# Patient Record
Sex: Male | Born: 1970 | Race: White | Hispanic: No | Marital: Married | Smoking: Never smoker
Health system: Southern US, Community
[De-identification: ages and names within clinical notes are randomized; demographics above are authoritative.]

## PROBLEM LIST (undated history)

## (undated) DIAGNOSIS — I10 Essential (primary) hypertension: Secondary | ICD-10-CM

## (undated) DIAGNOSIS — K219 Gastro-esophageal reflux disease without esophagitis: Secondary | ICD-10-CM

## (undated) DIAGNOSIS — K259 Gastric ulcer, unspecified as acute or chronic, without hemorrhage or perforation: Secondary | ICD-10-CM

## (undated) HISTORY — PX: KNEE SURGERY: SHX244

## (undated) HISTORY — PX: APPENDECTOMY: SHX54

## (undated) HISTORY — DX: Gastro-esophageal reflux disease without esophagitis: K21.9

## (undated) HISTORY — DX: Gastric ulcer, unspecified as acute or chronic, without hemorrhage or perforation: K25.9

---

## 2005-10-24 ENCOUNTER — Emergency Department: Payer: Self-pay | Admitting: Emergency Medicine

## 2006-03-21 ENCOUNTER — Ambulatory Visit: Payer: Self-pay | Admitting: Gastroenterology

## 2006-04-04 ENCOUNTER — Ambulatory Visit: Payer: Self-pay | Admitting: Gastroenterology

## 2008-06-17 ENCOUNTER — Ambulatory Visit: Payer: Self-pay | Admitting: Unknown Physician Specialty

## 2008-08-12 ENCOUNTER — Ambulatory Visit: Payer: Self-pay | Admitting: Unknown Physician Specialty

## 2008-08-19 ENCOUNTER — Ambulatory Visit: Payer: Self-pay | Admitting: Unknown Physician Specialty

## 2009-09-24 ENCOUNTER — Ambulatory Visit: Payer: Self-pay | Admitting: Otolaryngology

## 2017-02-05 ENCOUNTER — Ambulatory Visit
Admission: EM | Admit: 2017-02-05 | Discharge: 2017-02-05 | Disposition: A | Discharge status: Home / Self Care | Payer: BLUE CROSS/BLUE SHIELD | Attending: Family Medicine | Admitting: Family Medicine

## 2017-02-05 ENCOUNTER — Encounter: Payer: Self-pay | Admitting: *Deleted

## 2017-02-05 DIAGNOSIS — R319 Hematuria, unspecified: Secondary | ICD-10-CM | POA: Diagnosis not present

## 2017-02-05 DIAGNOSIS — R1030 Lower abdominal pain, unspecified: Secondary | ICD-10-CM

## 2017-02-05 DIAGNOSIS — Z87448 Personal history of other diseases of urinary system: Secondary | ICD-10-CM

## 2017-02-05 DIAGNOSIS — N410 Acute prostatitis: Secondary | ICD-10-CM

## 2017-02-05 DIAGNOSIS — M545 Low back pain: Secondary | ICD-10-CM | POA: Diagnosis not present

## 2017-02-05 HISTORY — DX: Essential (primary) hypertension: I10

## 2017-02-05 LAB — URINALYSIS, COMPLETE (UACMP) WITH MICROSCOPIC
BILIRUBIN URINE: NEGATIVE
Glucose, UA: NEGATIVE mg/dL
KETONES UR: NEGATIVE mg/dL
LEUKOCYTES UA: NEGATIVE
Nitrite: NEGATIVE
PH: 5.5 (ref 5.0–8.0)
PROTEIN: NEGATIVE mg/dL
SQUAMOUS EPITHELIAL / LPF: NONE SEEN
Specific Gravity, Urine: 1.025 (ref 1.005–1.030)

## 2017-02-05 LAB — CHLAMYDIA/NGC RT PCR (ARMC ONLY)
CHLAMYDIA TR: NOT DETECTED
N GONORRHOEAE: NOT DETECTED

## 2017-02-05 MED ORDER — CIPROFLOXACIN HCL 500 MG PO TABS: 500.0000 mg | ORAL_TABLET | Freq: Two times a day (BID) | ORAL | 0 refills | Status: DC

## 2017-02-05 MED ORDER — TAMSULOSIN HCL 0.4 MG PO CAPS: 0.4000 mg | ORAL_CAPSULE | Freq: Every day | ORAL | 0 refills | Status: DC

## 2017-02-05 NOTE — ED Provider Notes (Signed)
[facility]    CSN: [id number] Arrival date & time: 02/05/17  1057     History   Chief Complaint Chief Complaint  Patient presents with  . Abdominal Pain  . Back Pain    HPI NOHA KARASIK is a 46 y.o. male.   Patient is a 46 year old white male who is here because of multiple concerns multiple problems. Basically he's had lower back pain for over the last 3 months. Sometimes she's had hematuria in his urine as well and she has gotten real dark. He has had flank pain coming from the right side sometimes ago to his groin area. He is at other times the pain has not been that severe. This is been episodic with the pain occurring and then going away. States about 2 weeks ago he has severe pain in the axial took a couple days 4 days in fact of his Cipro that he had left over from a previous infection. He's had epididymitis before and Cipro has worked well for that. He's had a vasectomy. And had epididymitis after the vasectomy. He still had a documented kidney stones before however he states his father has had prostate trouble epididymitis also after having a vasectomy and also has had she stones multiple times. His father back told him he thought there was kidney stone than they change his mind because of change in the symptoms. He reports last 48 hours progressing lower back pain. Pain goes into his groin area and over both scrotal area. He states that when he has acute pain seems to retard ejaculation but that sometimes ejaculation does seem to help with the pain Vicodin last night but the pain has returned. He's never had a CT scan of his abdomen Limited evaluate for kidney stones. Smoked a stuffy tobacco or using illicit drugs. No previous surgeries other than vasectomy and he has had a colonoscopy. States that about age 8 he had a colonoscopy 2 polyps removed and he was frozen to have a repeat colonoscopy probably about 40-43. He is on Prilosec which she started when he was 15 and  that helps control his reflux. He's had upper endoscopy as well as colonoscopy. He states the last few days he has not seen blood in his urine but at least since December he's had 4 episodes where she became more dark and he thought he saw blood.   The history is provided by the patient. No language interpreter was used.  Abdominal Pain  Pain location:  Suprapubic, L flank and R flank Pain quality: aching, sharp, stabbing and throbbing   Pain radiates to:  Suprapubic region Pain severity:  Moderate Timing:  Intermittent Progression:  Worsening Chronicity:  New Relieved by:  Nothing Worsened by:  Nothing Ineffective treatments: cipro. Associated symptoms: hematuria   Associated symptoms: no chills, no constipation, no diarrhea, no melena, no nausea, no shortness of breath and no sore throat   Back Pain  Associated symptoms: abdominal pain     Past Medical History:  Diagnosis Date  . Hypertension     There are no active problems to display for this patient.   Past Surgical History:  Procedure Laterality Date  . APPENDECTOMY    . KNEE SURGERY         Home Medications    Prior to Admission medications   Medication Sig Start Date End Date Taking? Authorizing Provider  Ascorbic Acid (VITAMIN C) 1000 MG tablet Take 1,000 mg by mouth daily.   Yes Historical  Provider, MD  lisinopril (PRINIVIL,ZESTRIL) 10 MG tablet Take 10 mg by mouth daily.   Yes Historical Provider, MD  Omega-3 Fatty Acids (FISH OIL) 1000 MG CAPS Take by mouth.   Yes Historical Provider, MD  omeprazole (PRILOSEC) 20 MG capsule Take 20 mg by mouth daily.   Yes Historical Provider, MD    Family History Family History  Problem Relation Age of Onset  . Kidney Stones Father     Social History Social History  Substance Use Topics  . Smoking status: Never Smoker  . Smokeless tobacco: Never Used  . Alcohol use No     Allergies   Patient has no known allergies.   Review of Systems Review of Systems    Constitutional: Negative for chills.  HENT: Negative for sore throat.   Respiratory: Negative for shortness of breath.   Gastrointestinal: Positive for abdominal pain. Negative for constipation, diarrhea, melena and nausea.  Genitourinary: Positive for hematuria.  Musculoskeletal: Positive for back pain.  All other systems reviewed and are negative.    Physical Exam Triage Vital Signs ED Triage Vitals  Enc Vitals Group     BP 02/05/17 1128 (!) 162/114     Pulse Rate 02/05/17 1128 80     Resp 02/05/17 1128 18     Temp 02/05/17 1128 98.4 F (36.9 C)     Temp src --      SpO2 02/05/17 1128 100 %     Weight 02/05/17 1129 240 lb (108.9 kg)     Height 02/05/17 1129 6' (1.829 m)     Head Circumference --      Peak Flow --      Pain Score 02/05/17 1132 7     Pain Loc --      Pain Edu? --      Excl. in GC? --    No data found.   Updated Vital Signs BP (!) 162/114 (BP Location: Left Arm)   Pulse 80   Temp 98.4 F (36.9 C)   Resp 18   Ht 6' (1.829 m)   Wt 240 lb (108.9 kg)   SpO2 100%   BMI 32.55 kg/m   Visual Acuity Right Eye Distance:   Left Eye Distance:   Bilateral Distance:    Right Eye Near:   Left Eye Near:    Bilateral Near:     Physical Exam  Constitutional: He appears well-developed and well-nourished. He appears distressed.  HENT:  Head: Normocephalic and atraumatic.  Right Ear: External ear normal.  Left Ear: External ear normal.  Neck: Normal range of motion.  Cardiovascular: Normal rate and regular rhythm.   Pulmonary/Chest: Effort normal.  Abdominal: There is no hepatosplenomegaly. There is tenderness. There is no rigidity, no rebound, no guarding and no CVA tenderness. Hernia confirmed negative in the ventral area and confirmed negative in the right inguinal area.    Genitourinary: Rectum normal and testes normal. Prostate is enlarged and tender. Right testis shows no mass, no swelling and no tenderness. Right testis is descended. Cremasteric  reflex is not absent on the right side. Left testis shows no mass, no swelling and no tenderness. Left testis is descended. Cremasteric reflex is not absent on the left side. Circumcised. No hypospadias or penile tenderness.  Genitourinary Comments: Prostate slightly enlarged but markedly tender to palpation unable to even do Prostalac massage because of mild discomfort cost to the patient.  Vitals reviewed.    UC Treatments / Results  Labs (all labs ordered are  listed, but only abnormal results are displayed) Labs Reviewed  URINE CULTURE  CHLAMYDIA/NGC RT PCR ([facility] ONLY)  URINALYSIS, COMPLETE ([facility]MP) WITH MICROSCOPIC    EKG  EKG Interpretation None       Radiology No results found.  Procedures Procedures (including critical care time)  Medications Ordered in UC Medications - No data to display   Initial Impression / Assessment and Plan / UC Course  I have reviewed the triage vital signs and the nursing notes.  Pertinent labs & imaging results that were available during my care of the patient were reviewed by me and considered in my medical decision making (see chart for details).     While think patient has at least prostatitis still worried about the blood in his urine if he still has blood in his urine I'm going to recommend definite CT scan for stone study as well. Also 30 year be obtained as well  Final Clinical Impressions(s) / UC Diagnoses   Final diagnoses:  Acute prostatitis  Hx of hematuria    New Prescriptions New Prescriptions   No medications on file     Hassan Rowan, MD 02/05/17 2118

## 2017-02-05 NOTE — ED Triage Notes (Signed)
Patient started having lower abdominal pain and back pain 1 month ago that has come and gone. Patient also reports blood in urine and that he has taken a family members antibiotic with out relief.

## 2017-02-06 LAB — URINE CULTURE
CULTURE: NO GROWTH
Special Requests: NORMAL

## 2017-02-15 ENCOUNTER — Telehealth: Payer: Self-pay

## 2017-02-15 MED ORDER — CIPROFLOXACIN HCL 500 MG PO TABS: 500.0000 mg | ORAL_TABLET | Freq: Two times a day (BID) | ORAL | 0 refills | Status: DC

## 2017-02-15 NOTE — Telephone Encounter (Signed)
Patient called in to inquire about getting more of the Cipro to finish out his treatment. I spoke with Colon Flattery, PA and he called in Cipro  #20 BID

## 2017-02-19 NOTE — ED Notes (Signed)
I called [facility] and asked if they would please see Marcus Shaw for Enlarged prostate and hematuria. They are calling him to schedule appt.

## 2017-02-22 ENCOUNTER — Ambulatory Visit: Payer: BLUE CROSS/BLUE SHIELD | Admitting: Urology

## 2017-02-22 ENCOUNTER — Encounter: Payer: Self-pay | Admitting: Urology

## 2017-02-22 VITALS — BP 134/84 | HR 76 | Ht 72.0 in | Wt 240.0 lb

## 2017-02-22 DIAGNOSIS — M545 Low back pain, unspecified: Secondary | ICD-10-CM

## 2017-02-22 DIAGNOSIS — N410 Acute prostatitis: Secondary | ICD-10-CM

## 2017-02-22 DIAGNOSIS — N411 Chronic prostatitis: Secondary | ICD-10-CM

## 2017-02-22 DIAGNOSIS — G8929 Other chronic pain: Secondary | ICD-10-CM | POA: Diagnosis not present

## 2017-02-22 DIAGNOSIS — R310 Gross hematuria: Secondary | ICD-10-CM

## 2017-02-22 LAB — URINALYSIS, COMPLETE
BILIRUBIN UA: NEGATIVE
GLUCOSE, UA: NEGATIVE
KETONES UA: NEGATIVE
Leukocytes, UA: NEGATIVE
Nitrite, UA: NEGATIVE
Protein, UA: NEGATIVE
Specific Gravity, UA: 1.025 (ref 1.005–1.030)
Urobilinogen, Ur: 0.2 mg/dL (ref 0.2–1.0)
pH, UA: 6.5 (ref 5.0–7.5)

## 2017-02-22 LAB — BLADDER SCAN AMB NON-IMAGING

## 2017-02-22 MED ORDER — KETOROLAC TROMETHAMINE 60 MG/2ML IM SOLN
30.0000 mg | Freq: Once | INTRAMUSCULAR | Status: AC
  Administered 2017-02-22: 30 mg via INTRAMUSCULAR

## 2017-02-22 MED ORDER — IBUPROFEN 800 MG PO TABS: 800.0000 mg | ORAL_TABLET | Freq: Three times a day (TID) | ORAL | 0 refills | Status: AC | PRN

## 2017-02-22 NOTE — Progress Notes (Signed)
IM Injection  Patient is present today for an IM Injection for treatment of pain. Drug: Ketorlac Dose:30mg /68ml Location:left upper outer buttocks Lot: [id number] Exp:05/2018 Patient tolerated well, no complications were noted  Preformed by: Eligha Bridegroom, CMA

## 2017-02-22 NOTE — Progress Notes (Signed)
02/22/2017 1:09 PM   Marcus Shaw 1971/05/12 [id number]  Referring provider: No referring provider defined for this encounter.  Chief Complaint  Patient presents with  . New Patient (Initial Visit)    acute prostatitis    HPI: 46 year old male who presents today for further evaluation of possible acute prostatitis.  He reports that since October, he's had increasing issues with lower back pain which she never previously had. About a month ago, he developed severe pain radiating from his groin.  The pain radiated both to his rectal area as well as scrotal area.  After this had been going on for about 2 weeks, he was ultimately seen in the emergency room.  In the ED, he was noted to have a remarkably tender prostate and was started on Cipro for 30 day supply as well as started on Flomax.    He also reports today that he had an episode of gross hematuria that lasted only 1 day. He brings pictures with him from his iPhone showing orange/reddish tinged urine in the bowl. He had no associated dysuria, clots, or any other symptoms. This is not following activity.  He reports that his pain has improved some but not completely resolved.  Along with this, he has symptoms of incomplete bladder emptying, weak stream, difficulty urinating.  No microscopic blood is demonstrated today on UA or on previous labs.    He does have a remote history of epididymitis following vasectomy.   PVR 31 cc     IPSS    Row Name 02/22/17 1100         International Prostate Symptom Score   How often have you had the sensation of not emptying your bladder? Almost always     How often have you had to urinate less than every two hours? Less than half the time     How often have you found you stopped and started again several times when you urinated? More than half the time     How often have you found it difficult to postpone urination? About half the time     How often have you had a weak urinary stream?  About half the time     How often have you had to strain to start urination? About half the time     How many times did you typically get up at night to urinate? 1 Time     Total IPSS Score 21       Quality of Life due to urinary symptoms   If you were to spend the rest of your life with your urinary condition just the way it is now how would you feel about that? Pleased        Score:  1-7 Mild 8-19 Moderate 20-35 Severe   PMH: Past Medical History:  Diagnosis Date  . GERD (gastroesophageal reflux disease)   . Hypertension   . Stomach ulcer     Surgical History: Past Surgical History:  Procedure Laterality Date  . APPENDECTOMY    . KNEE SURGERY      Home Medications:  Allergies as of 02/22/2017   No Known Allergies     Medication List       Accurate as of 02/22/17  1:09 PM. Always use your most recent med list.          ciprofloxacin 500 MG tablet Commonly known as:  CIPRO Take 1 tablet (500 mg total) by mouth 2 (two) times daily.   Fish  Oil 1000 MG Caps Take by mouth.   ibuprofen 800 MG tablet Commonly known as:  ADVIL,MOTRIN Take 1 tablet (800 mg total) by mouth every 8 (eight) hours as needed.   lisinopril 10 MG tablet Commonly known as:  PRINIVIL,ZESTRIL Take 10 mg by mouth daily.   omeprazole 20 MG capsule Commonly known as:  PRILOSEC Take 20 mg by mouth daily.   tamsulosin 0.4 MG Caps capsule Commonly known as:  FLOMAX Take 1 capsule (0.4 mg total) by mouth daily.   vitamin C 1000 MG tablet Take 1,000 mg by mouth daily.       Allergies: No Known Allergies  Family History: Family History  Problem Relation Age of Onset  . Kidney Stones Father   . Prostate cancer Neg Hx   . Kidney cancer Neg Hx     Social History:  reports that he has never smoked. He has never used smokeless tobacco. He reports that he does not drink alcohol or use drugs.  ROS: UROLOGY Frequent Urination?: No Hard to postpone urination?: No Burning/pain with  urination?: Yes Get up at night to urinate?: No Leakage of urine?: No Urine stream starts and stops?: No Trouble starting stream?: No Do you have to strain to urinate?: No Blood in urine?: Yes Urinary tract infection?: No Sexually transmitted disease?: No Injury to kidneys or bladder?: No Painful intercourse?: Yes Weak stream?: No Erection problems?: No Penile pain?: Yes  Gastrointestinal Nausea?: No Vomiting?: No Indigestion/heartburn?: No Diarrhea?: No Constipation?: No  Constitutional Fever: No Night sweats?: No Weight loss?: No Fatigue?: No  Skin Skin rash/lesions?: No Itching?: No  Eyes Blurred vision?: No Double vision?: No  Ears/Nose/Throat Sore throat?: No Sinus problems?: No  Hematologic/Lymphatic Swollen glands?: No Easy bruising?: No  Cardiovascular Leg swelling?: No Chest pain?: No  Respiratory Cough?: No Shortness of breath?: No  Endocrine Excessive thirst?: No  Musculoskeletal Back pain?: No Joint pain?: No  Neurological Headaches?: No Dizziness?: No  Psychologic Depression?: No Anxiety?: No  Physical Exam: BP 134/84   Pulse 76   Ht 6' (1.829 m)   Wt 240 lb (108.9 kg)   BMI 32.55 kg/m   Constitutional:  Alert and oriented, No acute distress.  Accompanied by wife today. HEENT:  AT, moist mucus membranes.  Trachea midline, no masses. Cardiovascular: No clubbing, cyanosis, or edema. Respiratory: Normal respiratory effort, no increased work of breathing. GI: Abdomen is soft, nontender, nondistended, no abdominal masses GU: Mild tenderness in suprapubic area. Circumcised phallus with orthotopic patent meatus without discharge. Bilateral descended testicles unremarkable, nontender, no masses. Rectal: Normal sphincter tone. 30 cc prostate, mildly tender to palpation, no bogginess. Skin: No rashes, bruises or suspicious lesions. Lymph: No cervical or inguinal adenopathy. Neurologic: Grossly intact, no focal deficits, moving  all 4 extremities. Psychiatric: Normal mood and affect.  Laboratory Data:  Urinalysis Results for orders placed or performed in visit on 02/22/17  Urinalysis, Complete  Result Value Ref Range   Specific Gravity, UA 1.025 1.005 - 1.030   pH, UA 6.5 5.0 - 7.5   Color, UA Yellow Yellow   Appearance Ur Clear Clear   Leukocytes, UA Negative Negative   Protein, UA Negative Negative/Trace   Glucose, UA Negative Negative   Ketones, UA Negative Negative   RBC, UA Trace (A) Negative   Bilirubin, UA Negative Negative   Urobilinogen, Ur 0.2 0.2 - 1.0 mg/dL   Nitrite, UA Negative Negative  BLADDER SCAN AMB NON-IMAGING  Result Value Ref Range   Scan Result 31ml  Pertinent Imaging: No recent imaging  Assessment & Plan:    1. Chronic prostatitis Pain most consistent with chronic prostatitis, recommend continuation and completion of course of antibiotics, Flomax and supportive care with NSAIDs  He was given a dose of Toradol here in the office as he is quite uncomfortable after rectal exam, prescription for Motrin 800 mg 3 times a day 7 days given  - Urinalysis, Complete - BLADDER SCAN AMB NON-IMAGING - ketorolac (TORADOL) injection 30 mg; Inject 1 mL (30 mg total) into the muscle once.  2. Gross hematuria We discussed the differential diagnosis for hematuria including nephrolithiasis, renal or upper tract tumors, bladder stones, UTIs, or bladder tumors as well as undetermined etiologies. Per AUA guidelines, I did recommend complete hematuria evaluation including CTU, possible urine cytology, and office cystoscopy.  - CT HEMATURIA WORKUP; Future  3. Chronic midline low back pain without sciatica Low back pain unlikely to be related to current symptoms based on nature and timing - ketorolac (TORADOL) injection 30 mg; Inject 1 mL (30 mg total) into the muscle once.    Return in about 2 weeks (around 03/08/2017) for f/u CT urogram.  Vanna Scotland, MD  [facility] [address], [address] [place], [place] [address] [phone]

## 2017-02-27 ENCOUNTER — Ambulatory Visit
Admission: RE | Admit: 2017-02-27 | Discharge: 2017-02-27 | Disposition: A | Discharge status: Home / Self Care | Payer: BLUE CROSS/BLUE SHIELD | Source: Ambulatory Visit | Attending: Urology | Admitting: Urology

## 2017-02-27 DIAGNOSIS — R310 Gross hematuria: Secondary | ICD-10-CM | POA: Diagnosis not present

## 2017-02-27 MED ORDER — IOPAMIDOL (ISOVUE-300) INJECTION 61%
150.0000 mL | Freq: Once | INTRAVENOUS | Status: AC | PRN
  Administered 2017-02-27: 125 mL via INTRAVENOUS

## 2017-03-08 ENCOUNTER — Other Ambulatory Visit: Payer: Self-pay

## 2017-03-08 DIAGNOSIS — R3129 Other microscopic hematuria: Secondary | ICD-10-CM

## 2017-03-09 ENCOUNTER — Ambulatory Visit: Payer: Self-pay | Admitting: Urology

## 2017-03-09 ENCOUNTER — Other Ambulatory Visit
Admission: RE | Admit: 2017-03-09 | Discharge: 2017-03-09 | Disposition: A | Discharge status: Home / Self Care | Payer: BLUE CROSS/BLUE SHIELD | Source: Ambulatory Visit | Attending: Urology | Admitting: Urology

## 2017-03-09 ENCOUNTER — Encounter: Payer: Self-pay | Admitting: Urology

## 2017-03-09 ENCOUNTER — Ambulatory Visit (INDEPENDENT_AMBULATORY_CARE_PROVIDER_SITE_OTHER): Payer: BLUE CROSS/BLUE SHIELD | Admitting: Urology

## 2017-03-09 VITALS — BP 153/93 | HR 71 | Ht 72.0 in | Wt 240.0 lb

## 2017-03-09 DIAGNOSIS — R3129 Other microscopic hematuria: Secondary | ICD-10-CM

## 2017-03-09 DIAGNOSIS — R310 Gross hematuria: Secondary | ICD-10-CM | POA: Diagnosis not present

## 2017-03-09 DIAGNOSIS — N411 Chronic prostatitis: Secondary | ICD-10-CM

## 2017-03-09 DIAGNOSIS — M545 Low back pain: Secondary | ICD-10-CM

## 2017-03-09 DIAGNOSIS — G8929 Other chronic pain: Secondary | ICD-10-CM

## 2017-03-09 LAB — URINALYSIS, COMPLETE (UACMP) WITH MICROSCOPIC
Bacteria, UA: NONE SEEN
Bilirubin Urine: NEGATIVE
GLUCOSE, UA: NEGATIVE mg/dL
KETONES UR: NEGATIVE mg/dL
Leukocytes, UA: NEGATIVE
NITRITE: NEGATIVE
PROTEIN: NEGATIVE mg/dL
Specific Gravity, Urine: >1.03 — ABNORMAL HIGH (ref 1.005–1.030)
Squamous Epithelial / HPF: NONE SEEN
WBC UA: NONE SEEN WBC/hpf (ref 0–5)
pH: 5.5 (ref 5.0–8.0)

## 2017-03-09 MED ORDER — CIPROFLOXACIN HCL 500 MG PO TABS
500.0000 mg | ORAL_TABLET | Freq: Once | ORAL | Status: AC
  Administered 2017-03-09: 500 mg via ORAL

## 2017-03-09 MED ORDER — TAMSULOSIN HCL 0.4 MG PO CAPS: 0.4000 mg | ORAL_CAPSULE | Freq: Every day | ORAL | 11 refills | Status: DC

## 2017-03-09 NOTE — Progress Notes (Signed)
   03/09/17  CC: gross hematuria  HPI: 46 year old male with a recent episode of prostatitis along with endoscopic and gross hematuria who presents today to complete his hematuria workup.  Since last visit, he is completed a course of NSAIDs, Cipro, and Flomax. Overall, his urinary symptoms have completely resolved. He also notes significant improvement in his lower abdominal/bladder/prostate/testicular pain.  The pain is not completely resolved but its improved mostly after the course of NSAIDs.  He stopped taking Flomax as he ran out of this medication and notes that he's had a worsened urinary stream since stopping this medication.  He continues to have chronic low back pain.  CT urogram on 02/27/2017 is unremarkable for any GU pathology.  Blood pressure (!) 153/93, pulse 71, height 6' (1.829 m), weight 240 lb (108.9 kg). NED. A&Ox3.   No respiratory distress   Abd soft, NT, ND Normal phallus with bilateral descended testicles  Results for orders placed or performed during the hospital encounter of 03/09/17  Urinalysis, Complete w Microscopic  Result Value Ref Range   Color, Urine YELLOW YELLOW   APPearance CLEAR CLEAR   Specific Gravity, Urine >1.030 (H) 1.005 - 1.030   pH 5.5 5.0 - 8.0   Glucose, UA NEGATIVE NEGATIVE mg/dL   Hgb urine dipstick TRACE (A) NEGATIVE   Bilirubin Urine NEGATIVE NEGATIVE   Ketones, ur NEGATIVE NEGATIVE mg/dL   Protein, ur NEGATIVE NEGATIVE mg/dL   Nitrite NEGATIVE NEGATIVE   Leukocytes, UA NEGATIVE NEGATIVE   Squamous Epithelial / LPF NONE SEEN NONE SEEN   WBC, UA NONE SEEN 0 - 5 WBC/hpf   RBC / HPF 6-30 0 - 5 RBC/hpf   Bacteria, UA NONE SEEN NONE SEEN   Mucous PRESENT      Cystoscopy Procedure Note  Patient identification was confirmed, informed consent was obtained, and patient was prepped using Betadine solution.  Lidocaine jelly was administered per urethral meatus.    Preoperative abx where received prior to procedure.      Pre-Procedure: - Inspection reveals a normal caliber ureteral meatus.  Procedure: The flexible cystoscope was introduced without difficulty - No urethral strictures/lesions are present. - Normal prostate  - Normal bladder neck - Bilateral ureteral orifices identified - Bladder mucosa  reveals no ulcers, tumors, or lesions - No bladder stones - No trabeculation  Retroflexion unremarkable   Post-Procedure: - Patient tolerated the procedure well  Assessment/ Plan:  1. Gross hematuria/ microscopic hematuria Work up negative hematuria workup including CT urogram and cystoscopy - ciprofloxacin (CIPRO) tablet 500 mg; Take 1 tablet (500 mg total) by mouth once.  2. Chronic prostatitis Improving- nearly resolved Recommended continuation of Flomax until symptoms completely resolved or continue indefinitely if it helps his urinary stream  3. Chronic midline low back pain without sciatica Continue to suspect musculoskeletal etiology of this positional chronic back pain  Follow up as needed  [name], MD

## 2017-03-13 ENCOUNTER — Ambulatory Visit: Payer: BLUE CROSS/BLUE SHIELD | Admitting: Urology

## 2018-03-26 ENCOUNTER — Other Ambulatory Visit: Payer: Self-pay | Admitting: Urology

## 2018-10-18 IMAGING — CT CT ABD-PEL WO/W CM
3 of 12 series · 12 of 46 positions shown, 18 images · IV contrast (iopamidol)
Comparison: None.

CLINICAL DATA: Pt c/o gross hematuria 1 time during [REDACTED]. Pt
states he has prostatitis currently. Hx of appy. Bilateral Lower
back pain for months. NKI. ^125mL [id number]-RKK IOPAMIDOL
([id number]-RKK) INJECTION 61%

EXAM:
CT ABDOMEN AND PELVIS WITHOUT AND WITH CONTRAST
TECHNIQUE: Multidetector CT imaging of the abdomen and pelvis was performed
following the standard protocol before and following the bolus
administration of intravenous contrast.
CONTRAST:  125mL [id number]-RKK IOPAMIDOL ([id number]) INJECTION 61%

[Series 2: hematuria > 45 wo · axial · 0.76mm/px · z∈[-973,-598]mm · 6 of 107 slices shown, 11 images]
[im 16/107  soft-tissue]
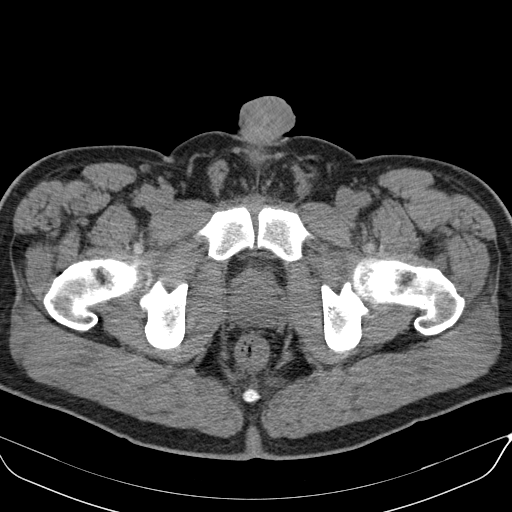
[im 16/107  bone]
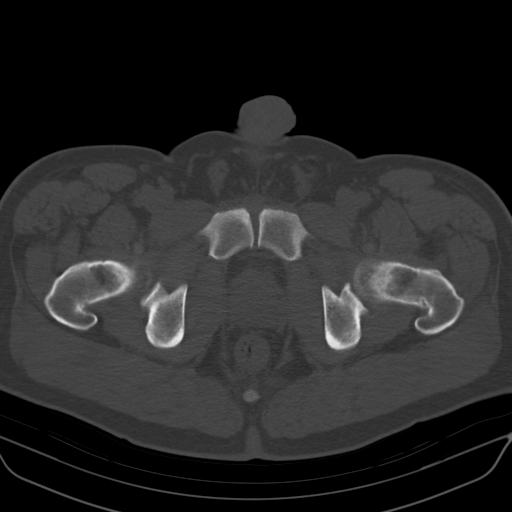
[im 31/107  soft-tissue]
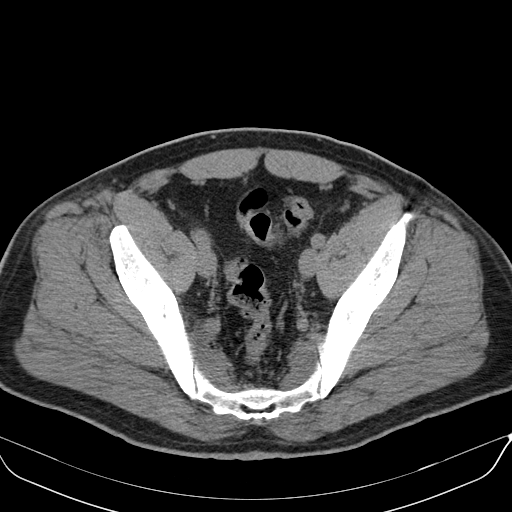
[im 46/107  soft-tissue]
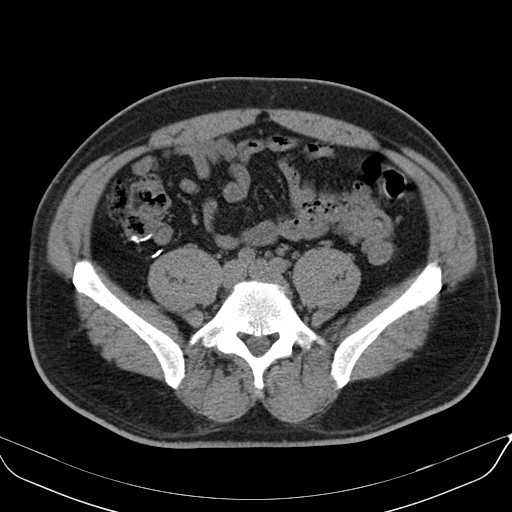
[im 46/107  lung]
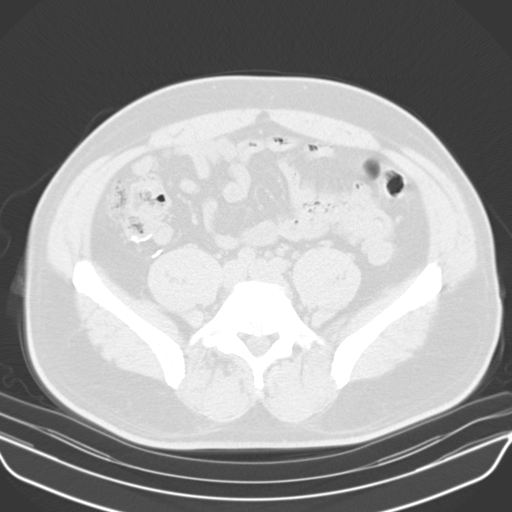
[im 61/107  soft-tissue]
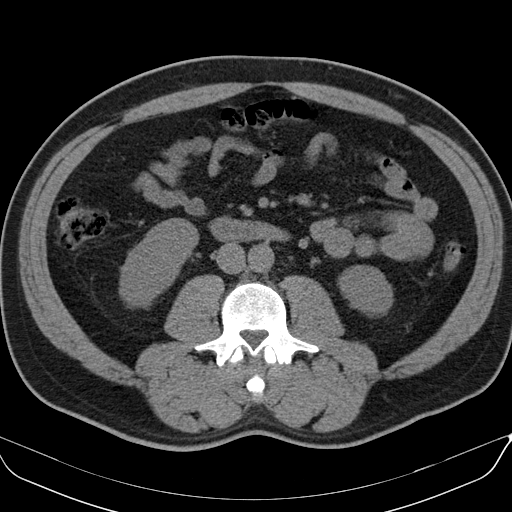
[im 61/107  lung]
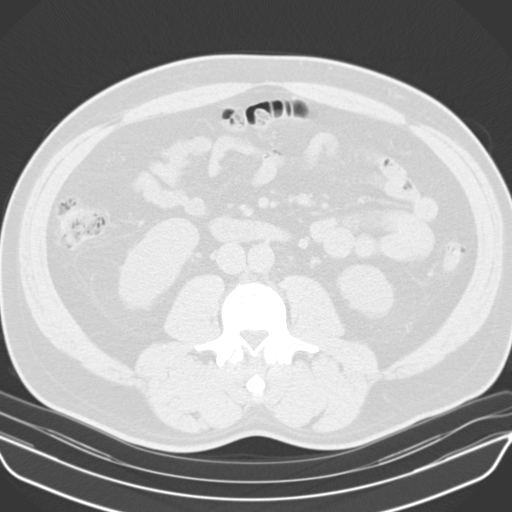
[im 76/107  soft-tissue]
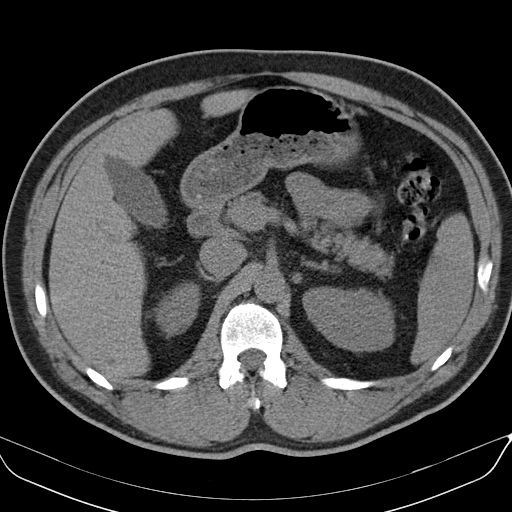
[im 76/107  lung]
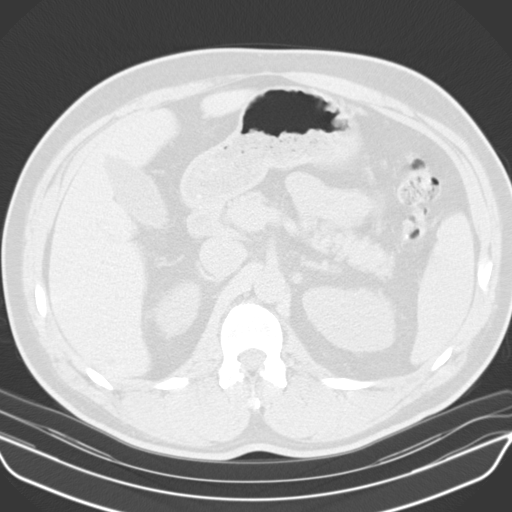
[im 91/107  soft-tissue]
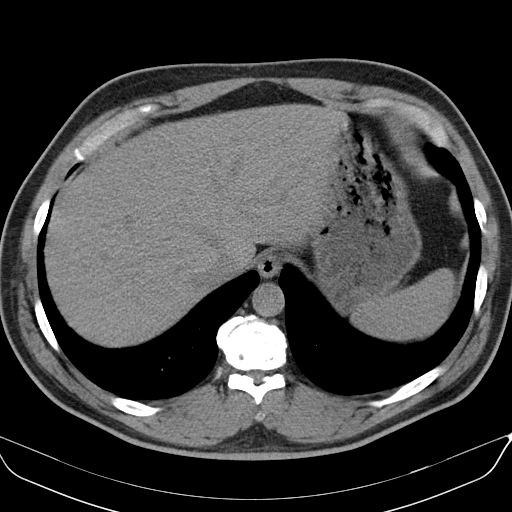
[im 91/107  lung]
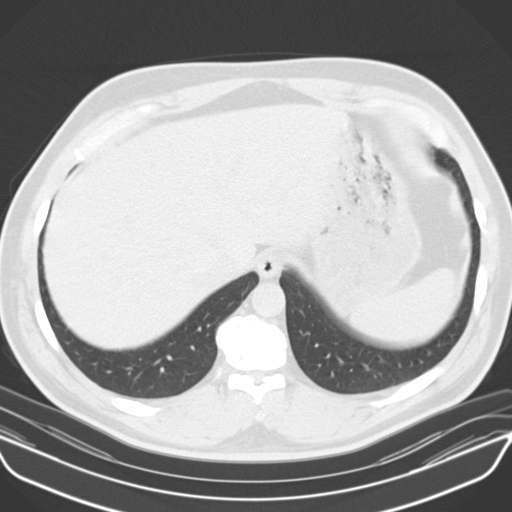

[Series 4: hematuria < 45 with 100s · axial · 0.76mm/px · z∈[-973,-748]mm · 4 of 107 slices shown]
[im 16/107  soft-tissue]
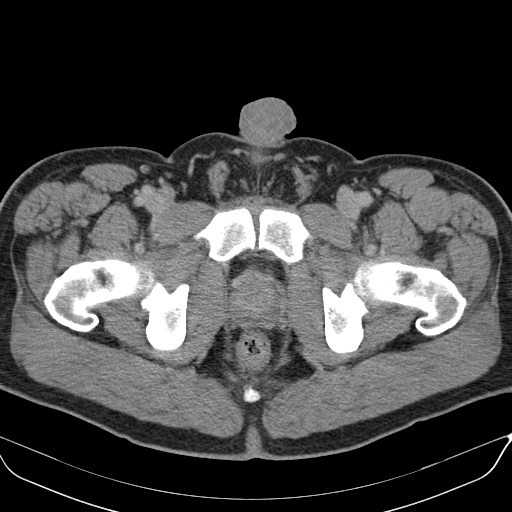
[im 31/107  soft-tissue]
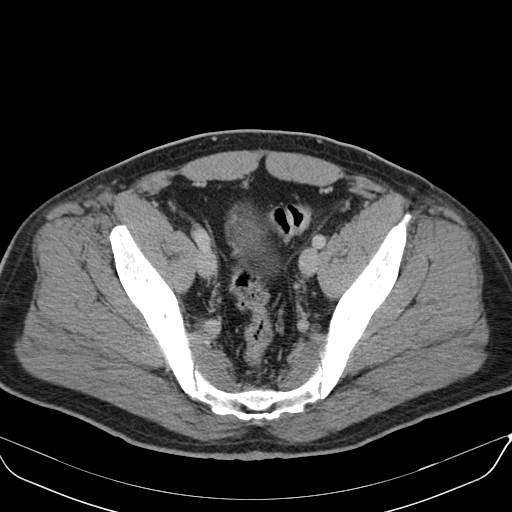
[im 46/107  soft-tissue]
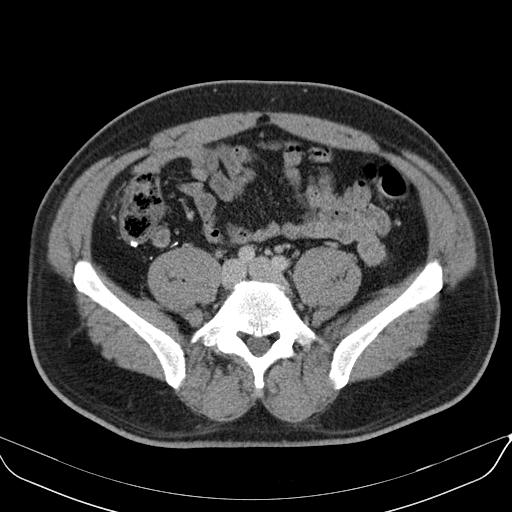
[im 61/107  soft-tissue]
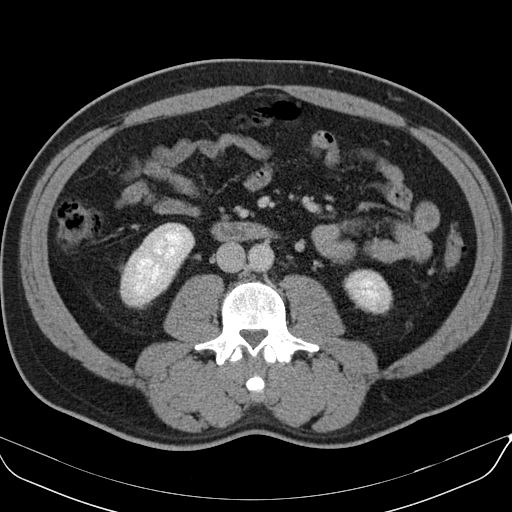

[Series 602: coronal wo · coronal · 1.04mm/px · 2 of 145 slices shown, 3 images]
[im 49/145  soft-tissue]
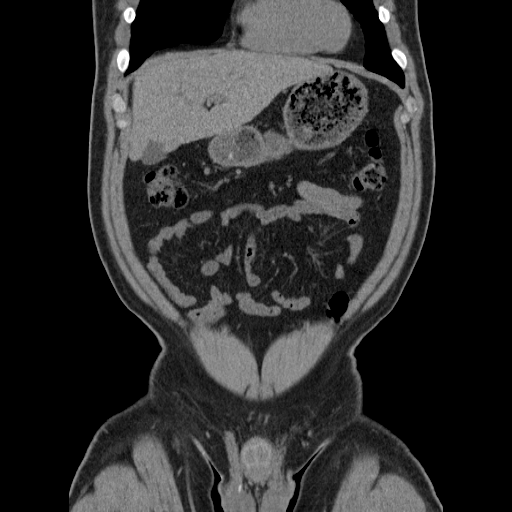
[im 49/145  bone]
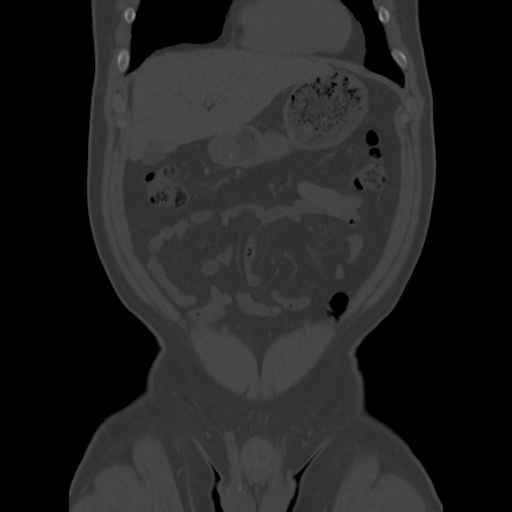
[im 97/145  soft-tissue]
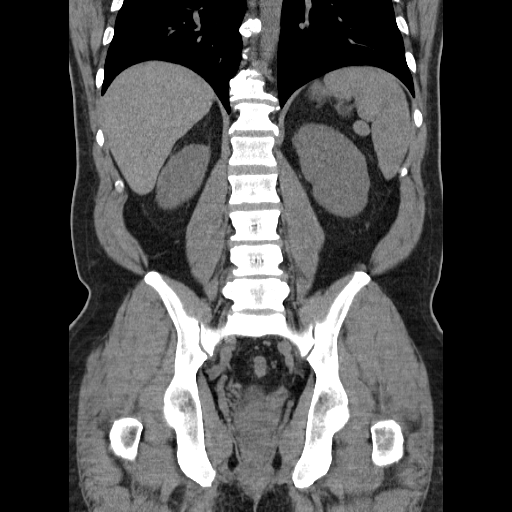

[12 of 46 positions shown; findings below may reference images not displayed]

FINDINGS: Lower chest: Lung bases are clear.

Hepatobiliary: Small round hypodense lesion posterior hepatic lobe
measuring mm (image 31, series 4). Normal gallbladder is

Pancreas: Pancreas is normal. No ductal dilatation. No pancreatic
inflammation.

Spleen: Normal spleen

Adrenals/urinary tract: Adrenal glands.

No nephrolithiasis or ureterolithiasis. No enhancing renal lesion.
Small hypodense lesion measuring 5 mm in the RIGHT renal cortex
(image 43, series 4) is too small to characterize but favor benign
cysts. Upper pole lesion on the RIGHT has low-density (image 36,
series 2).

No filling defects collecting systems.

No bladder calculi, enhancing bladder lesions, or filling defect
within the bladder.

Stomach/Bowel: Stomach, small-bowel cecum normal. Post appendectomy.
Colon rectosigmoid.

Vascular/Lymphatic: Abdominal aorta is normal caliber. There is no
retroperitoneal or periportal lymphadenopathy. No pelvic
lymphadenopathy.

Reproductive: Prostate normal)

Other: No free fluid.

Musculoskeletal: No aggressive osseous lesion.
IMPRESSION: 1. No explanation for hematuria. No nephrolithiasis,
ureterolithiasis, enhancing renal cortical lesion, or filling
defects within the collecting systems.
2. Small hypodense lesions in the RIGHT kidney are too small
characterize. Favor benign lesions.
3. No bladder stones or filling defects in the bladder which does
not excluded a bladder lesion.

## 2019-11-11 ENCOUNTER — Ambulatory Visit
Admission: EM | Admit: 2019-11-11 | Discharge: 2019-11-11 | Disposition: A | Discharge status: Home / Self Care | Payer: BLUE CROSS/BLUE SHIELD | Attending: Family Medicine | Admitting: Family Medicine

## 2019-11-11 ENCOUNTER — Other Ambulatory Visit: Payer: Self-pay

## 2019-11-11 ENCOUNTER — Encounter: Payer: Self-pay | Admitting: Emergency Medicine

## 2019-11-11 DIAGNOSIS — U071 COVID-19: Secondary | ICD-10-CM | POA: Insufficient documentation

## 2019-11-11 DIAGNOSIS — I10 Essential (primary) hypertension: Secondary | ICD-10-CM | POA: Insufficient documentation

## 2019-11-11 DIAGNOSIS — Z79899 Other long term (current) drug therapy: Secondary | ICD-10-CM | POA: Diagnosis not present

## 2019-11-11 DIAGNOSIS — R0981 Nasal congestion: Secondary | ICD-10-CM | POA: Diagnosis not present

## 2019-11-11 DIAGNOSIS — K219 Gastro-esophageal reflux disease without esophagitis: Secondary | ICD-10-CM | POA: Diagnosis not present

## 2019-11-11 DIAGNOSIS — R509 Fever, unspecified: Secondary | ICD-10-CM

## 2019-11-11 DIAGNOSIS — R197 Diarrhea, unspecified: Secondary | ICD-10-CM

## 2019-11-11 DIAGNOSIS — Z20822 Contact with and (suspected) exposure to covid-19: Secondary | ICD-10-CM

## 2019-11-11 DIAGNOSIS — R110 Nausea: Secondary | ICD-10-CM

## 2019-11-11 MED ORDER — IPRATROPIUM BROMIDE 0.06 % NA SOLN: 2.0000 | Freq: Four times a day (QID) | NASAL | 0 refills | Status: AC | PRN

## 2019-11-11 MED ORDER — ONDANSETRON HCL 4 MG PO TABS: 4.0000 mg | ORAL_TABLET | Freq: Three times a day (TID) | ORAL | 0 refills | Status: AC | PRN

## 2019-11-11 NOTE — Discharge Instructions (Addendum)
Medications as needed.  Stay home.  Take care  Dr. Adriana Simas

## 2019-11-11 NOTE — ED Provider Notes (Signed)
[facility]    CSN: [id number] Arrival date & time: 11/11/19  0859      History   Chief Complaint Chief Complaint  Patient presents with   Nausea   Abdominal Pain   Nasal Congestion   Headache   HPI  49 year old male presents with the above complaints in the setting of recent Covid exposure.  Son recently tested positive for Covid.  Patient reports ongoing symptoms started on Sunday.  He reports fever, nausea, abdominal pain, congestion, headache, diarrhea.  Symptoms are severe and worsening.  He also reports some chest tightness.  No shortness of breath.  Rates his pain as 5/10 in severity.  No known exacerbating or relieving factors.  No other complaints.  PMH, Surgical Hx, Family Hx, Social History reviewed and updated as below.  Past Medical History:  Diagnosis Date   GERD (gastroesophageal reflux disease)    Hypertension    Stomach ulcer    Past Surgical History:  Procedure Laterality Date   APPENDECTOMY     KNEE SURGERY     Home Medications    Prior to Admission medications   Medication Sig Start Date End Date Taking? Authorizing Provider  Ascorbic Acid (VITAMIN C) 1000 MG tablet Take 1,000 mg by mouth daily.   Yes [provider]  ibuprofen (ADVIL,MOTRIN) 800 MG tablet Take 1 tablet (800 mg total) by mouth every 8 (eight) hours as needed. 02/22/17  Yes Hollice Espy, MD  lisinopril (PRINIVIL,ZESTRIL) 10 MG tablet Take 10 mg by mouth daily.   Yes [provider]  Omega-3 Fatty Acids (FISH OIL) 1000 MG CAPS Take by mouth.   Yes [provider]  omeprazole (PRILOSEC) 20 MG capsule Take 20 mg by mouth daily.   Yes [provider]  ipratropium (ATROVENT) 0.06 % nasal spray Place 2 sprays into both nostrils 4 (four) times daily as needed for rhinitis. 11/11/19   Coral Spikes, DO  ondansetron (ZOFRAN) 4 MG tablet Take 1 tablet (4 mg total) by mouth every 8 (eight) hours as needed for nausea or vomiting. 11/11/19    Coral Spikes, DO    Family History Family History  Problem Relation Age of Onset   Kidney Stones Father    Prostate cancer Neg Hx    Kidney cancer Neg Hx     Social History Social History   Tobacco Use   Smoking status: Never Smoker   Smokeless tobacco: Never Used  Substance Use Topics   Alcohol use: No   Drug use: No     Allergies   Patient has no known allergies.   Review of Systems Review of Systems  Constitutional: Positive for fever.  HENT: Positive for congestion.   Respiratory: Positive for chest tightness.   Gastrointestinal: Positive for abdominal pain, diarrhea and nausea.   Physical Exam Triage Vital Signs ED Triage Vitals  Enc Vitals Group     BP 11/11/19 0926 (!) 153/112     Pulse Rate 11/11/19 0926 60     Resp 11/11/19 0926 18     Temp 11/11/19 0926 98.2 F (36.8 C)     Temp Source 11/11/19 0926 Oral     SpO2 11/11/19 0926 100 %     Weight 11/11/19 0924 213 lb (96.6 kg)     Height 11/11/19 0924 6' (1.829 m)     Head Circumference --      Peak Flow --      Pain Score 11/11/19 0923 5     Pain  Loc --      Pain Edu? --      Excl. in GC? --    Updated Vital Signs BP (!) 153/112 (BP Location: Left Arm)    Pulse 60    Temp 98.2 F (36.8 C) (Oral)    Resp 18    Ht 6' (1.829 m)    Wt 96.6 kg    SpO2 100%    BMI 28.89 kg/m   Visual Acuity Right Eye Distance:   Left Eye Distance:   Bilateral Distance:    Right Eye Near:   Left Eye Near:    Bilateral Near:     Physical Exam Vitals and nursing note reviewed.  Constitutional:      General: He is not in acute distress.    Appearance: Normal appearance. He is not ill-appearing.  HENT:     Head: Normocephalic and atraumatic.  Eyes:     General:        Right eye: No discharge.        Left eye: No discharge.     Conjunctiva/sclera: Conjunctivae normal.  Cardiovascular:     Rate and Rhythm: Normal rate and regular rhythm.     Heart sounds: No murmur.  Pulmonary:     Effort:  Pulmonary effort is normal.     Breath sounds: Normal breath sounds. No wheezing, rhonchi or rales.  Abdominal:     General: There is no distension.     Palpations: Abdomen is soft.     Tenderness: There is no abdominal tenderness.  Neurological:     Mental Status: He is alert.  Psychiatric:        Mood and Affect: Mood normal.        Behavior: Behavior normal.    UC Treatments / Results  Labs (all labs ordered are listed, but only abnormal results are displayed) Labs Reviewed  NOVEL CORONAVIRUS, NAA (HOSP ORDER, SEND-OUT TO REF LAB; TAT 18-24 HRS)    EKG   Radiology No results found.  Procedures Procedures (including critical care time)  Medications Ordered in UC Medications - No data to display  Initial Impression / Assessment and Plan / UC Course  I have reviewed the triage vital signs and the nursing notes.  Pertinent labs & imaging results that were available during my care of the patient were reviewed by me and considered in my medical decision making (see chart for details).    49 year old male presents with suspected COVID-19.  Zofran and Atrovent nasal spray for symptomatic relief.  Awaiting test results.  Supportive care.  Final Clinical Impressions(s) / UC Diagnoses   Final diagnoses:  Suspected COVID-19 virus infection     Discharge Instructions     Medications as needed.  Stay home.  Take care  Dr. Adriana Simas    ED Prescriptions    Medication Sig Dispense Auth. Provider   ondansetron (ZOFRAN) 4 MG tablet Take 1 tablet (4 mg total) by mouth every 8 (eight) hours as needed for nausea or vomiting. 20 tablet Chlora Mcbain G, DO   ipratropium (ATROVENT) 0.06 % nasal spray Place 2 sprays into both nostrils 4 (four) times daily as needed for rhinitis. 15 mL Tommie Sams, DO     PDMP not reviewed this encounter.   Tommie Sams, [place] 11/11/19 1015

## 2019-11-11 NOTE — ED Triage Notes (Signed)
Patient has positive exposure to COVID from his son on Sunday. Patient c/o low grade fever, nausea and abdominal pain, nasal congestion and headache that started on Sunday.

## 2019-11-13 ENCOUNTER — Telehealth: Payer: Self-pay | Admitting: Emergency Medicine

## 2019-11-13 LAB — NOVEL CORONAVIRUS, NAA (HOSP ORDER, SEND-OUT TO REF LAB; TAT 18-24 HRS): SARS-CoV-2, NAA: DETECTED — AB

## 2019-11-13 NOTE — Telephone Encounter (Signed)
Your test for COVID-19 was positive, meaning that you were infected with the novel coronavirus and could give the germ to others.  Please continue isolation at home, for at least 10 days since the start of your fever/cough/breathlessness and until you have had 3 consecutive days without fever (without taking a fever reducer) and with cough/breathlessness improving. Please continue good preventive care measures, including:  frequent hand-washing, avoid touching your face, cover coughs/sneezes, stay out of crowds and keep a 6 foot distance from others.  Recheck or go to the nearest hospital ED tent for re-assessment if fever/cough/breathlessness return.  Patient contacted and made aware of all results, all questions answered.   

## 2019-11-18 ENCOUNTER — Other Ambulatory Visit: Payer: Self-pay | Admitting: Family Medicine
